# Patient Record
Sex: Male | Born: 1990 | Race: Black or African American | Hispanic: No | Marital: Single | Smoking: Former smoker
Health system: Southern US, Community
[De-identification: ages and names within clinical notes are randomized; demographics above are authoritative.]

---

## 2011-03-12 ENCOUNTER — Emergency Department (HOSPITAL_COMMUNITY)
Admission: EM | Admit: 2011-03-12 | Discharge: 2011-03-12 | Disposition: A | Discharge status: Home / Self Care | Payer: No Typology Code available for payment source | Attending: Emergency Medicine | Admitting: Emergency Medicine

## 2011-03-12 ENCOUNTER — Emergency Department (HOSPITAL_COMMUNITY): Payer: No Typology Code available for payment source

## 2011-03-12 ENCOUNTER — Encounter: Payer: Self-pay | Admitting: *Deleted

## 2011-03-12 DIAGNOSIS — M25519 Pain in unspecified shoulder: Secondary | ICD-10-CM | POA: Insufficient documentation

## 2011-03-12 DIAGNOSIS — M25512 Pain in left shoulder: Secondary | ICD-10-CM

## 2011-03-12 DIAGNOSIS — S139XXA Sprain of joints and ligaments of unspecified parts of neck, initial encounter: Secondary | ICD-10-CM | POA: Insufficient documentation

## 2011-03-12 DIAGNOSIS — S161XXA Strain of muscle, fascia and tendon at neck level, initial encounter: Secondary | ICD-10-CM

## 2011-03-12 DIAGNOSIS — M542 Cervicalgia: Secondary | ICD-10-CM | POA: Insufficient documentation

## 2011-03-12 MED ORDER — KETOROLAC TROMETHAMINE 60 MG/2ML IM SOLN
60.0000 mg | Freq: Once | INTRAMUSCULAR | Status: AC
  Administered 2011-03-12: 60 mg via INTRAMUSCULAR
  Filled 2011-03-12: qty 2

## 2011-03-12 MED ORDER — DIAZEPAM 5 MG PO TABS: 5.0000 mg | ORAL_TABLET | Freq: Three times a day (TID) | ORAL | Status: AC | PRN

## 2011-03-12 MED ORDER — IBUPROFEN 800 MG PO TABS: 800.0000 mg | ORAL_TABLET | Freq: Three times a day (TID) | ORAL | Status: AC
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
## 2011-03-12 NOTE — ED Notes (Signed)
Patient transported to X-ray 
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
## 2011-03-14 NOTE — ED Provider Notes (Signed)
Medical screening examination/treatment/procedure(s) were performed by non-physician practitioner and as supervising physician I was immediately available for consultation/collaboration.   Suzi Roots, MD 03/14/11 [phone]

## 2016-11-22 ENCOUNTER — Ambulatory Visit (HOSPITAL_COMMUNITY): Admission: EM | Admit: 2016-11-22 | Discharge: 2016-11-22 | Discharge status: Against Medical Advice | Payer: Self-pay

## 2016-11-22 NOTE — ED Triage Notes (Signed)
C/O right testicular pain since this AM that at times went up into abdomen and had pt vomiting.  Over past hour pain has subsided significantly.  Pt states he wishes to wait to be seen now that he is feeling much better.  Informed pt he can always go to ED throughout the night if he changes his mind.  Verbalized understanding.

## 2016-11-23 ENCOUNTER — Encounter (HOSPITAL_COMMUNITY): Payer: Self-pay | Admitting: *Deleted

## 2016-11-23 ENCOUNTER — Emergency Department (HOSPITAL_COMMUNITY): Payer: Self-pay

## 2016-11-23 ENCOUNTER — Emergency Department (HOSPITAL_COMMUNITY)
Admission: EM | Admit: 2016-11-23 | Discharge: 2016-11-23 | Disposition: A | Discharge status: Home / Self Care | Payer: Self-pay | Attending: Emergency Medicine | Admitting: Emergency Medicine

## 2016-11-23 DIAGNOSIS — Z87891 Personal history of nicotine dependence: Secondary | ICD-10-CM | POA: Insufficient documentation

## 2016-11-23 DIAGNOSIS — N201 Calculus of ureter: Secondary | ICD-10-CM | POA: Insufficient documentation

## 2016-11-23 LAB — URINALYSIS, ROUTINE W REFLEX MICROSCOPIC
Glucose, UA: NEGATIVE mg/dL
KETONES UR: 15 mg/dL — AB
LEUKOCYTES UA: NEGATIVE
NITRITE: POSITIVE — AB
PH: 5.5 (ref 5.0–8.0)
Protein, ur: 100 mg/dL — AB
Specific Gravity, Urine: >1.03 — ABNORMAL HIGH (ref 1.005–1.030)

## 2016-11-23 LAB — COMPREHENSIVE METABOLIC PANEL
ALT: 18 U/L (ref 17–63)
ANION GAP: 11 (ref 5–15)
AST: 20 U/L (ref 15–41)
Albumin: 4.3 g/dL (ref 3.5–5.0)
Alkaline Phosphatase: 63 U/L (ref 38–126)
BILIRUBIN TOTAL: 1.2 mg/dL (ref 0.3–1.2)
BUN: 8 mg/dL (ref 6–20)
CO2: 23 mmol/L (ref 22–32)
Calcium: 9.5 mg/dL (ref 8.9–10.3)
Chloride: 103 mmol/L (ref 101–111)
Creatinine, Ser: 1.19 mg/dL (ref 0.61–1.24)
Glucose, Bld: 106 mg/dL — ABNORMAL HIGH (ref 65–99)
POTASSIUM: 3.9 mmol/L (ref 3.5–5.1)
Sodium: 137 mmol/L (ref 135–145)
TOTAL PROTEIN: 7.4 g/dL (ref 6.5–8.1)

## 2016-11-23 LAB — CBC
HEMATOCRIT: 49.8 % (ref 39.0–52.0)
HEMOGLOBIN: 17 g/dL (ref 13.0–17.0)
MCH: 31.4 pg (ref 26.0–34.0)
MCHC: 34.1 g/dL (ref 30.0–36.0)
MCV: 92.1 fL (ref 78.0–100.0)
Platelets: 279 10*3/uL (ref 150–400)
RBC: 5.41 MIL/uL (ref 4.22–5.81)
RDW: 13.2 % (ref 11.5–15.5)
WBC: 11.2 10*3/uL — AB (ref 4.0–10.5)

## 2016-11-23 LAB — LIPASE, BLOOD: Lipase: 41 U/L (ref 11–51)

## 2016-11-23 LAB — URINALYSIS, MICROSCOPIC (REFLEX)

## 2016-11-23 MED ORDER — HYDROCODONE-ACETAMINOPHEN 5-325 MG PO TABS: 1.0000 | ORAL_TABLET | Freq: Four times a day (QID) | ORAL | 0 refills | Status: AC | PRN

## 2016-11-23 MED ORDER — TAMSULOSIN HCL 0.4 MG PO CAPS: 0.4000 mg | ORAL_CAPSULE | Freq: Every day | ORAL | 0 refills | Status: AC

## 2016-11-23 MED ORDER — KETOROLAC TROMETHAMINE 15 MG/ML IJ SOLN
15.0000 mg | Freq: Once | INTRAMUSCULAR | Status: DC
Start: 2016-11-23 — End: 2016-11-23

## 2016-11-23 MED ORDER — IBUPROFEN 600 MG PO TABS: 600.0000 mg | ORAL_TABLET | Freq: Four times a day (QID) | ORAL | 0 refills | Status: AC | PRN

## 2016-11-23 MED ORDER — ONDANSETRON 8 MG PO TBDP: 8.0000 mg | ORAL_TABLET | Freq: Three times a day (TID) | ORAL | 0 refills | Status: AC | PRN

## 2016-11-23 MED ORDER — KETOROLAC TROMETHAMINE 15 MG/ML IJ SOLN
30.0000 mg | Freq: Once | INTRAMUSCULAR | Status: AC
  Administered 2016-11-23: 30 mg via INTRAMUSCULAR
  Filled 2016-11-23: qty 2

## 2016-11-23 MED ORDER — ONDANSETRON 4 MG PO TBDP
4.0000 mg | ORAL_TABLET | Freq: Once | ORAL | Status: AC
  Administered 2016-11-23: 4 mg via ORAL
  Filled 2016-11-23: qty 1

## 2016-11-23 NOTE — ED Notes (Signed)
Patient transported to CT 

## 2016-11-23 NOTE — Discharge Instructions (Signed)
We saw you in the ER for the abdominal pain. °Our results indicate that you have a kidney stone. °We were able to get your pain is relative control, and we can safely send you home. ° °Take the meds prescribed. °Set up an appointment with the Urologist. °If the pain is unbearable, you start having fevers, chills, and are unable to keep any meds down - then return to the ER. °  °

## 2016-11-23 NOTE — ED Triage Notes (Signed)
The pt was masterbating 0800 and had a sharp pain in his scrotum  Since then he has  Had sharp  There and the pain is increasing  He started vomitng at 1400

## 2016-11-23 NOTE — ED Provider Notes (Signed)
[facility]-EMERGENCY DEPT Provider Note   CSN: [id number] Arrival date & time: 11/23/16  0220     History   Chief Complaint Chief Complaint  Patient presents with  . Abdominal Pain    HPI Dustin Clarke is a 26 y.o. male.  HPI Pt comes in with cc of R sided pain. Pt has no medical hx. Pt reports that he started having intermittent pain yday. Pt had pain radiating from the flank region anteriorly, and on occaison he had pain in his scrotal region. Pt went to urgent care in the evening when the pain was severe, but he left when the pain got better while he was waiting. Pt woke up in the middle of the night with severe pain, so he decided to come back to the ER. Pt has no dysuria, hematuria, polyuria.   History reviewed. No pertinent past medical history.  There are no active problems to display for this patient.   History reviewed. No pertinent surgical history.     Home Medications    Prior to Admission medications   Medication Sig Start Date End Date Taking? Authorizing Provider  HYDROcodone-acetaminophen (NORCO/VICODIN) 5-325 MG tablet Take 1 tablet by mouth every 6 (six) hours as needed. 11/23/16   Derwood Kaplan, MD  ibuprofen (ADVIL,MOTRIN) 600 MG tablet Take 1 tablet (600 mg total) by mouth every 6 (six) hours as needed. 11/23/16   Derwood Kaplan, MD  ondansetron (ZOFRAN ODT) 8 MG disintegrating tablet Take 1 tablet (8 mg total) by mouth every 8 (eight) hours as needed for nausea. 11/23/16   Derwood Kaplan, MD  tamsulosin (FLOMAX) 0.4 MG CAPS capsule Take 1 capsule (0.4 mg total) by mouth daily. 11/23/16   Derwood Kaplan, MD    Family History No family history on file.  Social History Social History  Substance Use Topics  . Smoking status: Former Games developer  . Smokeless tobacco: Never Used  . Alcohol use Yes     Allergies   Patient has no known allergies.   Review of Systems Review of Systems  Gastrointestinal: Positive for abdominal pain and nausea.    Genitourinary: Positive for flank pain. Negative for discharge, dysuria and hematuria.  All other systems reviewed and are negative.    Physical Exam Updated Vital Signs BP 117/84   Pulse 79   Temp 98.8 F (37.1 C) (Oral)   Resp 16   Ht  (1.676 m)   Wt 59 kg (130 lb)   SpO2 98%   BMI 20.98 kg/m   Physical Exam  Constitutional: He is oriented to person, place, and time. He appears well-developed.  HENT:  Head: Atraumatic.  Neck: Neck supple.  Cardiovascular: Normal rate.   Pulmonary/Chest: Effort normal.  Abdominal: Soft. There is no tenderness.  Neurological: He is alert and oriented to person, place, and time.  Skin: Skin is warm.  Nursing note and vitals reviewed.    ED Treatments / Results  Labs (all labs ordered are listed, but only abnormal results are displayed) Labs Reviewed  COMPREHENSIVE METABOLIC PANEL - Abnormal; Notable for the following:       Result Value   Glucose, Bld 106 (*)    All other components within normal limits  CBC - Abnormal; Notable for the following:    WBC 11.2 (*)    All other components within normal limits  URINALYSIS, ROUTINE W REFLEX MICROSCOPIC - Abnormal; Notable for the following:    Color, Urine AMBER (*)    APPearance CLOUDY (*)  Specific Gravity, Urine >1.030 (*)    Hgb urine dipstick LARGE (*)    Bilirubin Urine SMALL (*)    Ketones, ur 15 (*)    Protein, ur 100 (*)    Nitrite POSITIVE (*)    All other components within normal limits  URINALYSIS, MICROSCOPIC (REFLEX) - Abnormal; Notable for the following:    Bacteria, UA FEW (*)    Squamous Epithelial / LPF 0-5 (*)    All other components within normal limits  LIPASE, BLOOD  GC/CHLAMYDIA PROBE AMP ([place]) NOT AT [facility]    EKG  EKG Interpretation None       Radiology Ct Renal Stone Study  Result Date: 11/23/2016 CLINICAL DATA:  Right flank pain, nausea, vomiting, and hematuria. EXAM: CT ABDOMEN AND PELVIS WITHOUT CONTRAST TECHNIQUE:  Multidetector CT imaging of the abdomen and pelvis was performed following the standard protocol without IV contrast. COMPARISON:  None. FINDINGS: Lower chest: Lung bases are clear. Hepatobiliary: No focal liver abnormality is seen. No gallstones, gallbladder wall thickening, or biliary dilatation. Pancreas: Unremarkable. No pancreatic ductal dilatation or surrounding inflammatory changes. Spleen: Normal in size without focal abnormality. Adrenals/Urinary Tract: No adrenal gland nodules. Punctate sized intrarenal stones in the left kidney. No hydronephrosis on the left. 5 mm stone in the mid right ureter at the level of L4. Proximal hydronephrosis and hydroureter with stranding around the ureter. Distal ureter is decompressed. Bladder is decompressed. Stomach/Bowel: Stomach, small bowel, and colon are mostly decompressed with scattered stool in the colon. Appendix is normal. Vascular/Lymphatic: No significant vascular findings are present. No enlarged abdominal or pelvic lymph nodes. Reproductive: Prostate is unremarkable. Other: No abdominal wall hernia or abnormality. No abdominopelvic ascites. Musculoskeletal: No acute or significant osseous findings. IMPRESSION: 5 mm stone in the mid right ureter with moderate proximal obstruction. Punctate nonobstructing intrarenal stones in the left kidney. Electronically Signed   By: Burman NievesWilliam  Stevens M.D.   On: 11/23/2016 06:32    Procedures Procedures (including critical care time)  Medications Ordered in ED Medications  ketorolac (TORADOL) 15 MG/ML injection 30 mg (not administered)  ondansetron (ZOFRAN-ODT) disintegrating tablet 4 mg (not administered)     Initial Impression / Assessment and Plan / ED Course  I have reviewed the triage vital signs and the nursing notes.  Pertinent labs & imaging results that were available during my care of the patient were reviewed by me and considered in my medical decision making (see chart for details).     Pt comes  in with flank pain radiating towards the groin on the r side, and the Ct confirms a renal stone. Pain is in control. Results from the ER workup discussed with the patient face to face and all questions answered to the best of my ability. Stable for d/c.  Final Clinical Impressions(s) / ED Diagnoses   Final diagnoses:  Ureteral stone    New Prescriptions New Prescriptions   HYDROCODONE-ACETAMINOPHEN (NORCO/VICODIN) 5-325 MG TABLET    Take 1 tablet by mouth every 6 (six) hours as needed.   IBUPROFEN (ADVIL,MOTRIN) 600 MG TABLET    Take 1 tablet (600 mg total) by mouth every 6 (six) hours as needed.   ONDANSETRON (ZOFRAN ODT) 8 MG DISINTEGRATING TABLET    Take 1 tablet (8 mg total) by mouth every 8 (eight) hours as needed for nausea.   TAMSULOSIN (FLOMAX) 0.4 MG CAPS CAPSULE    Take 1 capsule (0.4 mg total) by mouth daily.     Derwood KaplanNanavati, Wood Novacek, MD 11/23/16 [phone]

## 2018-10-19 IMAGING — CT CT RENAL STONE PROTOCOL
2 of 4 series · 16 of 46 positions shown, 18 images · non-contrast
Comparison: None.

CLINICAL DATA: Right flank pain, nausea, vomiting, and hematuria.

EXAM:
CT ABDOMEN AND PELVIS WITHOUT CONTRAST
TECHNIQUE: Multidetector CT imaging of the abdomen and pelvis was performed
following the standard protocol without IV contrast.

[Series 5: cor · coronal · 0.72mm/px · 3 of 73 slices shown]
[im 25/73  soft-tissue]
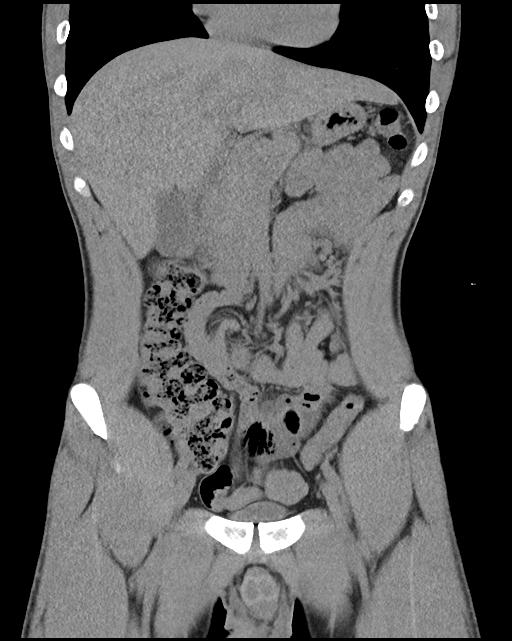
[im 33/73  soft-tissue]
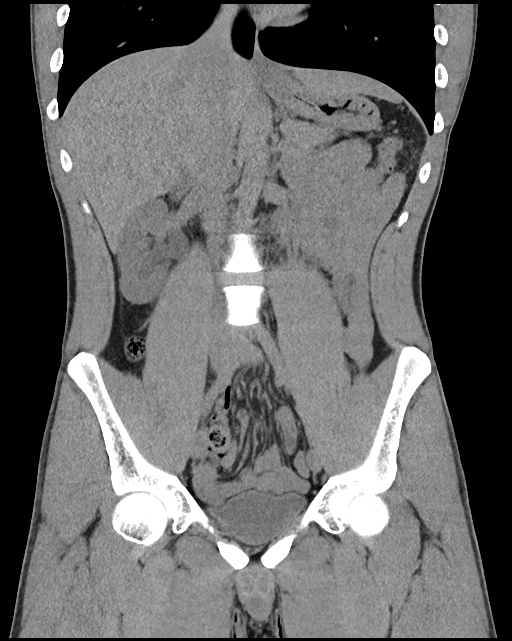
[im 41/73  soft-tissue]
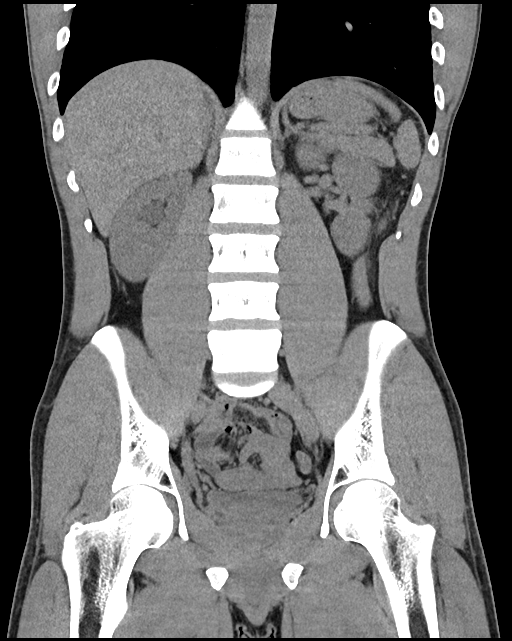

[Series 7: ap without · axial · non-contrast · 0.59mm/px · z∈[+715,+1125]mm · 13 of 92 slices shown, 15 images]
[im 5/92  soft-tissue]
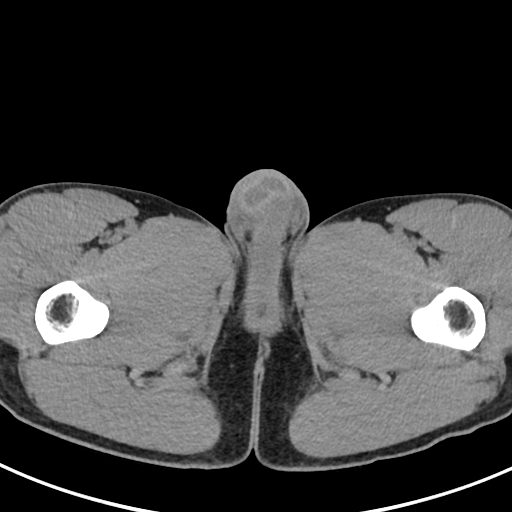
[im 5/92  bone]
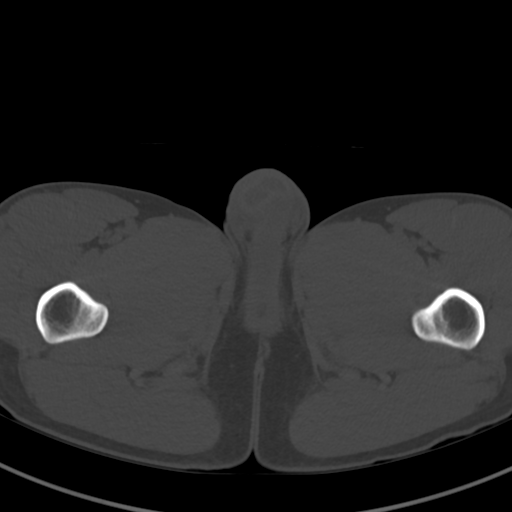
[im 15/92  soft-tissue]
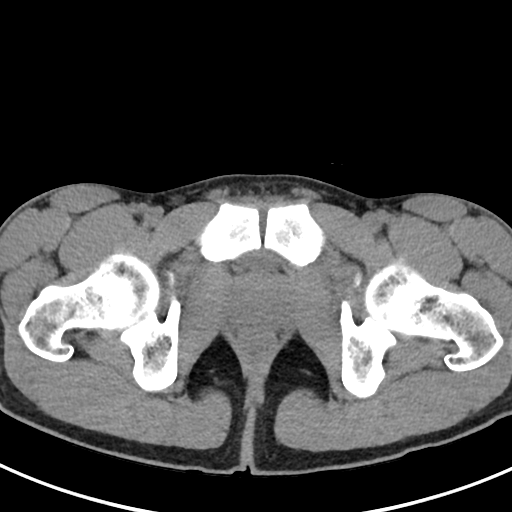
[im 20/92  soft-tissue]
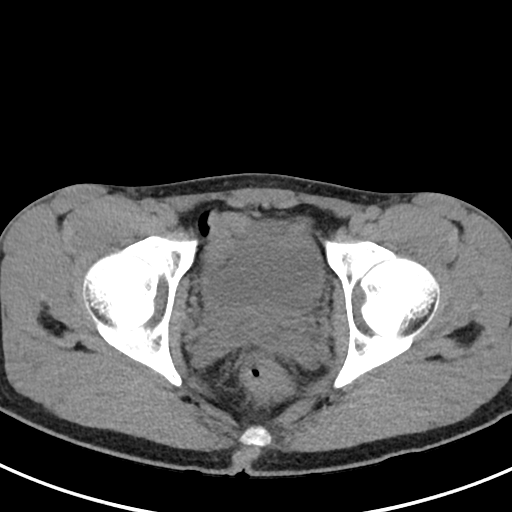
[im 24/92  soft-tissue]
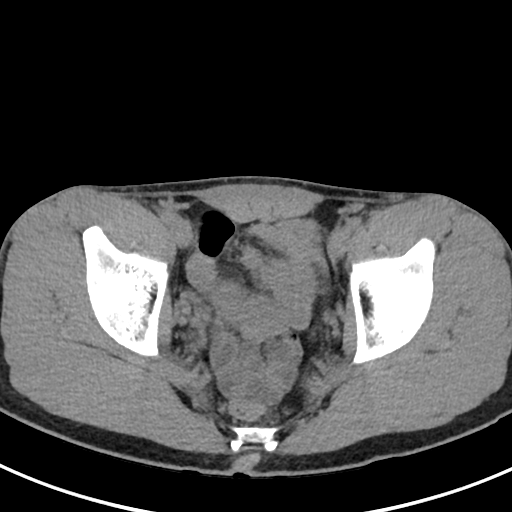
[im 34/92  soft-tissue]
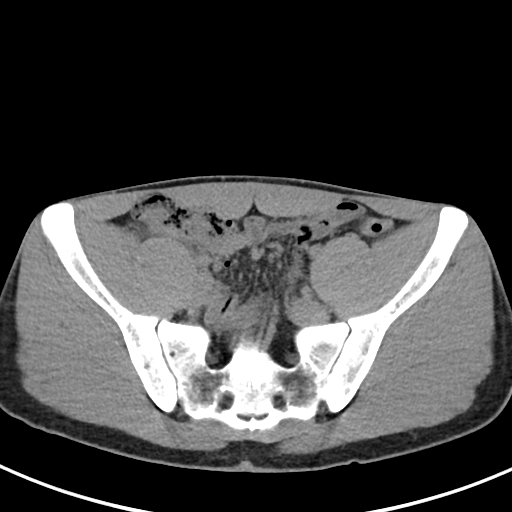
[im 39/92  soft-tissue]
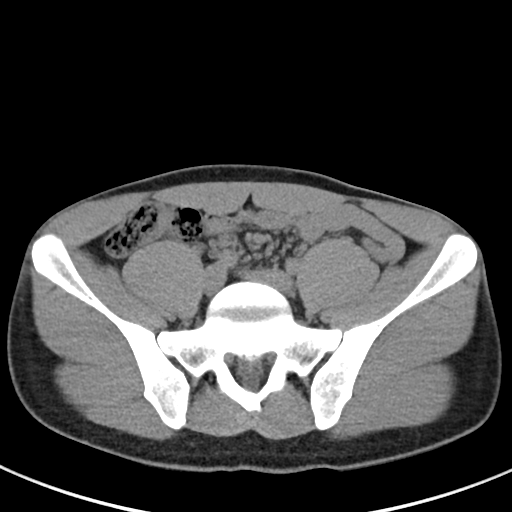
[im 48/92  soft-tissue]
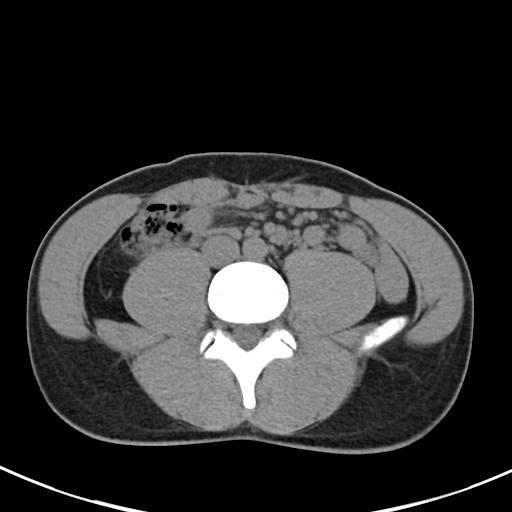
[im 53/92  soft-tissue]
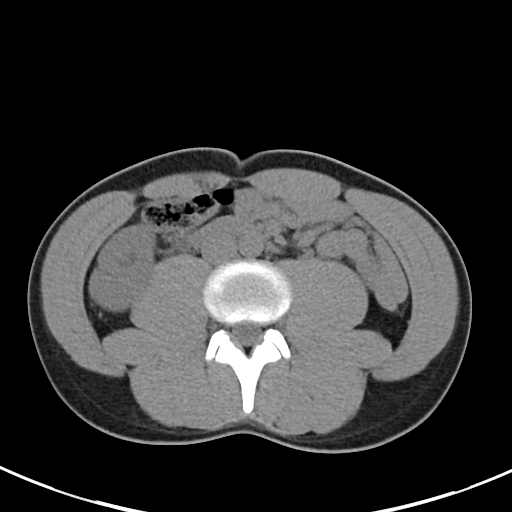
[im 58/92  soft-tissue]
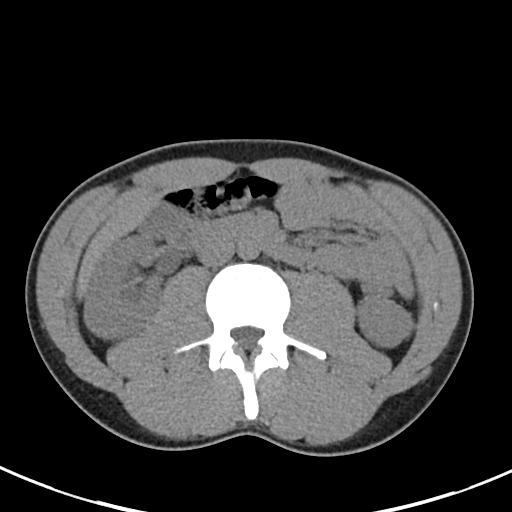
[im 58/92  bone]
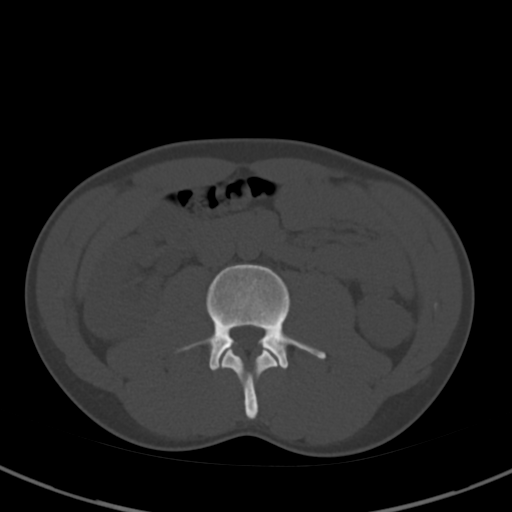
[im 68/92  soft-tissue]
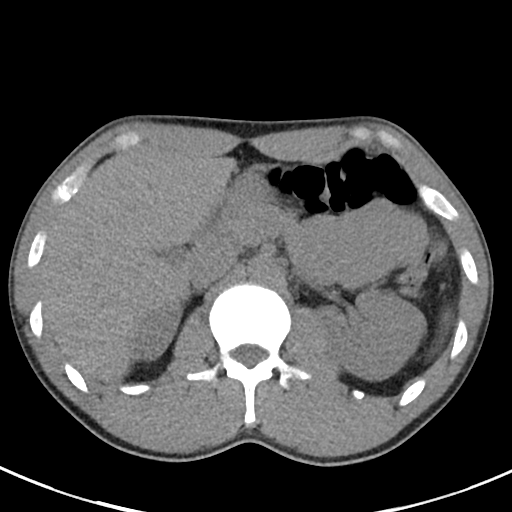
[im 72/92  soft-tissue]
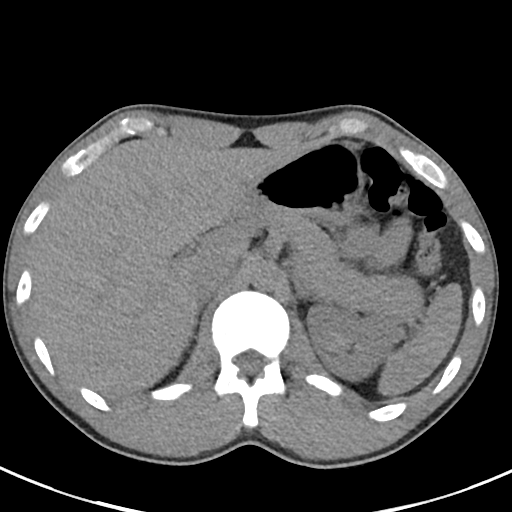
[im 77/92  soft-tissue]
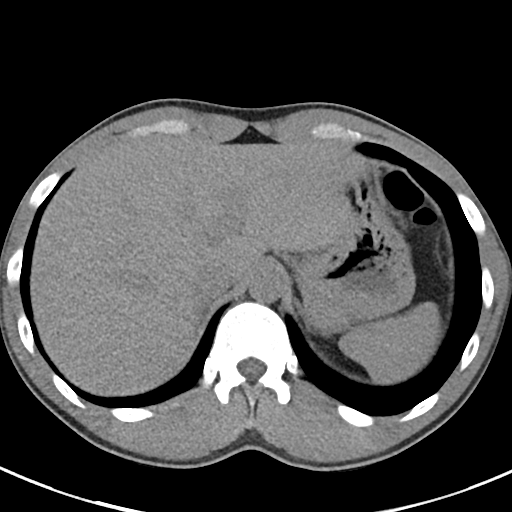
[im 87/92  soft-tissue]
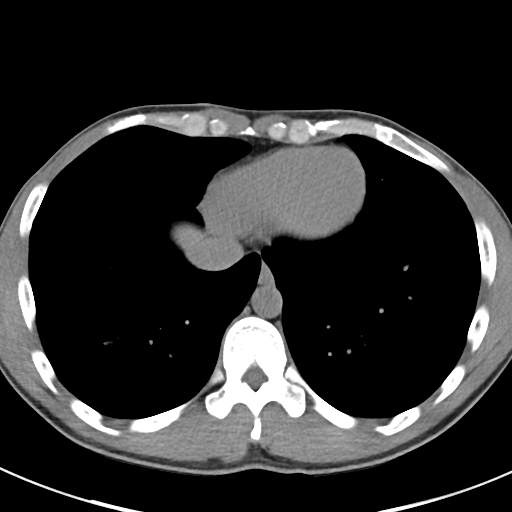

[16 of 46 positions shown; findings below may reference images not displayed]

FINDINGS: Lower chest: Lung bases are clear.

Hepatobiliary: No focal liver abnormality is seen. No gallstones,
gallbladder wall thickening, or biliary dilatation.

Pancreas: Unremarkable. No pancreatic ductal dilatation or
surrounding inflammatory changes.

Spleen: Normal in size without focal abnormality.

Adrenals/Urinary Tract: No adrenal gland nodules. Punctate sized
intrarenal stones in the left kidney. No hydronephrosis on the left.
5 mm stone in the mid right ureter at the level of L4. Proximal
hydronephrosis and hydroureter with stranding around the ureter.
Distal ureter is decompressed. Bladder is decompressed.

Stomach/Bowel: Stomach, small bowel, and colon are mostly
decompressed with scattered stool in the colon. Appendix is normal.

Vascular/Lymphatic: No significant vascular findings are present. No
enlarged abdominal or pelvic lymph nodes.

Reproductive: Prostate is unremarkable.

Other: No abdominal wall hernia or abnormality. No abdominopelvic
ascites.

Musculoskeletal: No acute or significant osseous findings.
IMPRESSION: 5 mm stone in the mid right ureter with moderate proximal
obstruction. Punctate nonobstructing intrarenal stones in the left
kidney.
